# Patient Record
Sex: Female | Born: 1995 | Race: White | Hispanic: Yes | Marital: Married | Smoking: Former smoker
Health system: Southern US, Community
[De-identification: ages and names within clinical notes are randomized; demographics above are authoritative.]

## PROBLEM LIST (undated history)

## (undated) DIAGNOSIS — E282 Polycystic ovarian syndrome: Secondary | ICD-10-CM

---
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
## 2013-01-16 ENCOUNTER — Emergency Department (HOSPITAL_COMMUNITY)
Admission: EM | Admit: 2013-01-16 | Discharge: 2013-01-16 | Disposition: A | Discharge status: Home / Self Care | Payer: Medicaid Other | Attending: Emergency Medicine | Admitting: Emergency Medicine

## 2013-01-16 ENCOUNTER — Encounter (HOSPITAL_COMMUNITY): Payer: Self-pay | Admitting: Emergency Medicine

## 2013-01-16 DIAGNOSIS — Z8719 Personal history of other diseases of the digestive system: Secondary | ICD-10-CM | POA: Insufficient documentation

## 2013-01-16 DIAGNOSIS — R109 Unspecified abdominal pain: Secondary | ICD-10-CM

## 2013-01-16 DIAGNOSIS — F17200 Nicotine dependence, unspecified, uncomplicated: Secondary | ICD-10-CM | POA: Insufficient documentation

## 2013-01-16 DIAGNOSIS — R1013 Epigastric pain: Secondary | ICD-10-CM | POA: Insufficient documentation

## 2013-01-16 DIAGNOSIS — N390 Urinary tract infection, site not specified: Secondary | ICD-10-CM | POA: Insufficient documentation

## 2013-01-16 DIAGNOSIS — Z3202 Encounter for pregnancy test, result negative: Secondary | ICD-10-CM | POA: Insufficient documentation

## 2013-01-16 LAB — CBC WITH DIFFERENTIAL/PLATELET
Hemoglobin: 15.7 g/dL — ABNORMAL HIGH (ref 12.0–15.0)
Lymphocytes Relative: 21 % (ref 12–46)
Lymphs Abs: 1.8 10*3/uL (ref 0.7–4.0)
MCH: 31.3 pg (ref 26.0–34.0)
Monocytes Relative: 5 % (ref 3–12)
Neutro Abs: 6.6 10*3/uL (ref 1.7–7.7)
Neutrophils Relative %: 74 % (ref 43–77)
RBC: 5.02 MIL/uL (ref 3.87–5.11)

## 2013-01-16 LAB — URINALYSIS, ROUTINE W REFLEX MICROSCOPIC
Bilirubin Urine: NEGATIVE
Nitrite: NEGATIVE
Specific Gravity, Urine: 1.02 (ref 1.005–1.030)
pH: 6.5 (ref 5.0–8.0)

## 2013-01-16 LAB — BASIC METABOLIC PANEL
BUN: 6 mg/dL (ref 6–23)
CO2: 26 mEq/L (ref 19–32)
Chloride: 102 mEq/L (ref 96–112)
Glucose, Bld: 94 mg/dL (ref 70–99)
Potassium: 3.7 mEq/L (ref 3.5–5.1)

## 2013-01-16 LAB — URINE MICROSCOPIC-ADD ON

## 2013-01-16 MED ORDER — NITROFURANTOIN MONOHYD MACRO 100 MG PO CAPS: 100.0000 mg | ORAL_CAPSULE | Freq: Two times a day (BID) | ORAL | Status: DC

## 2013-01-16 MED ORDER — FAMOTIDINE 20 MG PO TABS: 20.0000 mg | ORAL_TABLET | Freq: Two times a day (BID) | ORAL | Status: DC

## 2013-01-16 MED ORDER — FAMOTIDINE 20 MG PO TABS
40.0000 mg | ORAL_TABLET | Freq: Once | ORAL | Status: AC
  Administered 2013-01-16: 40 mg via ORAL
  Filled 2013-01-16: qty 2

## 2013-01-16 MED ORDER — GI COCKTAIL ~~LOC~~
30.0000 mL | Freq: Once | ORAL | Status: AC
  Administered 2013-01-16: 30 mL via ORAL
  Filled 2013-01-16: qty 30

## 2013-01-16 NOTE — ED Notes (Signed)
POCT pregnancy Negative 

## 2013-01-16 NOTE — ED Notes (Signed)
Pt presents with 2 day h/o generalized abdominal pain x 2 days.  Pt reports pain began to suprapubic area, radiates throughout abdomen and around to bilateral flanks.; +nausea and diarrhea.  Pt denies any dysuria, dark-colored urine or vaginal discharge.

## 2013-01-16 NOTE — ED Notes (Signed)
Pt c/o generalized abd pain into back with some dysuria; pt sts irregular periods with LMP in November; pt sts some nausea and diarrhea

## 2013-01-16 NOTE — ED Provider Notes (Signed)
History     CSN: [id number]  Arrival date & time 01/16/13  1151   First MD Initiated Contact with Patient 01/16/13 1416      Chief Complaint  Patient presents with  . Abdominal Pain    (Consider location/radiation/quality/duration/timing/severity/associated sxs/prior treatment) HPI Comments: Patient presents with complaint of upper nominal pain described as a burning sensation for the past 2-3 days. Patient states that the pain radiates to her back at times. She states that she has had symptoms similar to this with previous episodes of gastritis. No treatments prior to arrival. No history of abdominal surgeries. No fever, nausea, vomiting, or diarrhea. No urinary symptoms. Patient states that her menstrual periods are regular. No current bleeding or discharge. Patient denies heavy NSAID use, heavy alcohol use. Symptoms not associated with food or time of day. Onset of symptoms gradual. Course is constant. Nothing makes symptoms better or worse.  The history is provided by the patient.    History reviewed. No pertinent past medical history.  History reviewed. No pertinent past surgical history.  History reviewed. No pertinent family history.  History  Substance Use Topics  . Smoking status: Current Every Day Smoker -- 0.00 packs/day for 1 years    Types: Cigarettes  . Smokeless tobacco: Not on file  . Alcohol Use: No    OB History   Grav Para Term Preterm Abortions TAB SAB Ect Mult Living                  Review of Systems  Constitutional: Negative for fever.  HENT: Negative for sore throat and rhinorrhea.   Eyes: Negative for redness.  Respiratory: Negative for cough.   Cardiovascular: Negative for chest pain.  Gastrointestinal: Positive for abdominal pain. Negative for nausea, vomiting and diarrhea.  Genitourinary: Negative for dysuria.  Musculoskeletal: Negative for myalgias.  Skin: Negative for rash.  Neurological: Negative for headaches.    Allergies  Review  of patient's allergies indicates no known allergies.  Home Medications   Current Outpatient Rx  Name  Route  Sig  Dispense  Refill  . famotidine (PEPCID) 20 MG tablet   Oral   Take 1 tablet (20 mg total) by mouth 2 (two) times daily.   20 tablet   0   . nitrofurantoin, macrocrystal-monohydrate, (MACROBID) 100 MG capsule   Oral   Take 1 capsule (100 mg total) by mouth 2 (two) times daily.   10 capsule   0     BP 134/77  Pulse 107  Temp(Src) 98 F (36.7 C) (Oral)  Resp 18  SpO2 100%  Physical Exam  Nursing note and vitals reviewed. Constitutional: She appears well-developed and well-nourished.  HENT:  Head: Normocephalic and atraumatic.  Eyes: Conjunctivae are normal. Right eye exhibits no discharge. Left eye exhibits no discharge.  Neck: Normal range of motion. Neck supple.  Cardiovascular: Normal rate, regular rhythm and normal heart sounds.   Pulmonary/Chest: Effort normal and breath sounds normal.  Abdominal: Soft. Bowel sounds are normal. She exhibits no distension. There is tenderness (Mild, epigastric). There is no rebound and no guarding.  Neurological: She is alert.  Skin: Skin is warm and dry.  Psychiatric: She has a normal mood and affect.    ED Course  Procedures (including critical care time)  Labs Reviewed  URINALYSIS, ROUTINE W REFLEX MICROSCOPIC - Abnormal; Notable for the following:    APPearance HAZY (*)    Hgb urine dipstick TRACE (*)    Leukocytes, UA LARGE (*)  All other components within normal limits  CBC WITH DIFFERENTIAL - Abnormal; Notable for the following:    Hemoglobin 15.7 (*)    All other components within normal limits  BASIC METABOLIC PANEL - Abnormal; Notable for the following:    Creatinine, Ser 0.48 (*)    All other components within normal limits  URINE MICROSCOPIC-ADD ON - Abnormal; Notable for the following:    Squamous Epithelial / LPF MANY (*)    Bacteria, UA FEW (*)    All other components within normal limits   URINE CULTURE  LIPASE, BLOOD  POCT PREGNANCY, URINE   No results found.   1. Abdominal pain   2. UTI (lower urinary tract infection)     3:27 PM Patient seen and examined.   Vital signs reviewed and are as follows: Filed Vitals:   01/16/13 1154  BP: 134/77  Pulse: 107  Temp: 98 F (36.7 C)  Resp: 18   Patient informed of all results. She was given a GI cocktail in emergency department with minimal relief.  Will continue to treat with acid blocking medications. Will discharge home with antibiotic for urinary tract infection.  The patient was urged to return to the Emergency Department immediately with worsening of current symptoms, worsening abdominal pain, persistent vomiting, blood noted in stools, fever, or any other concerns. The patient verbalized understanding.      MDM  Patient with epigastric, burning pain most consistent with PUD/GERD. Will treat empirically. Minimal tenderness. Lab work is reassuring indication for CT scan at this time. Return instructions given. UA suggestive of UTI, no pyelo, no CVA tenderness. Patient appears well, non-toxic.          [place], [place] 01/16/13 1616

## 2013-01-17 LAB — URINE CULTURE

## 2013-01-19 NOTE — ED Provider Notes (Signed)
Medical screening examination/treatment/procedure(s) were performed by non-physician practitioner and as supervising physician I was immediately available for consultation/collaboration.   Gwyneth Sprout, MD 01/19/13 2303

## 2013-03-21 IMAGING — US US PELVIS COMPLETE
1 series · 13 of 25 positions shown · non-contrast
Comparison: CT of the abdomen and pelvis performed 05/28/2012

CLINICAL DATA: Vaginal bleeding and bilateral adnexal pain.
History of polycystic ovarian syndrome.

TRANSABDOMINAL AND TRANSVAGINAL ULTRASOUND OF PELVIS
TECHNIQUE: Both transabdominal and transvaginal ultrasound
examinations of the pelvis were performed. Transabdominal technique
was performed for global imaging of the pelvis including uterus,
ovaries, adnexal regions, and pelvic cul-de-sac.
It was necessary to proceed with endovaginal exam following the
transabdominal exam to visualize the uterus and ovaries in greater
detail.

[Series 1: us pelvis complete · 0.25mm/px · 84 acquisitions, 13 frames shown]
[im 1/84]
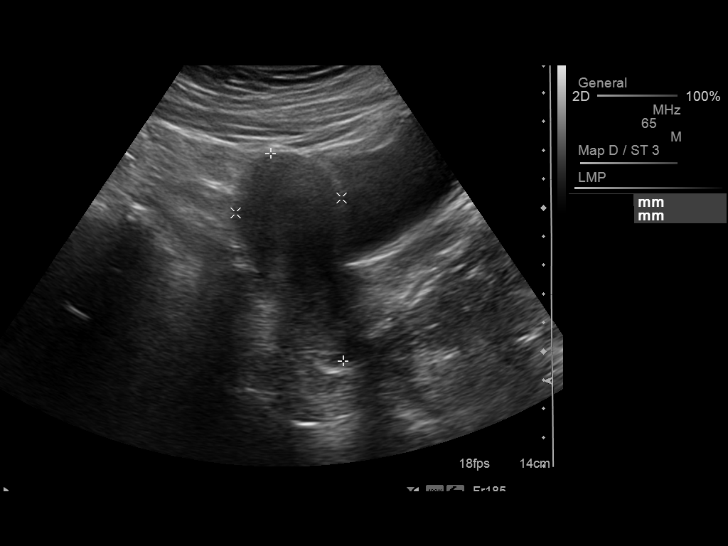
[im 7/84]
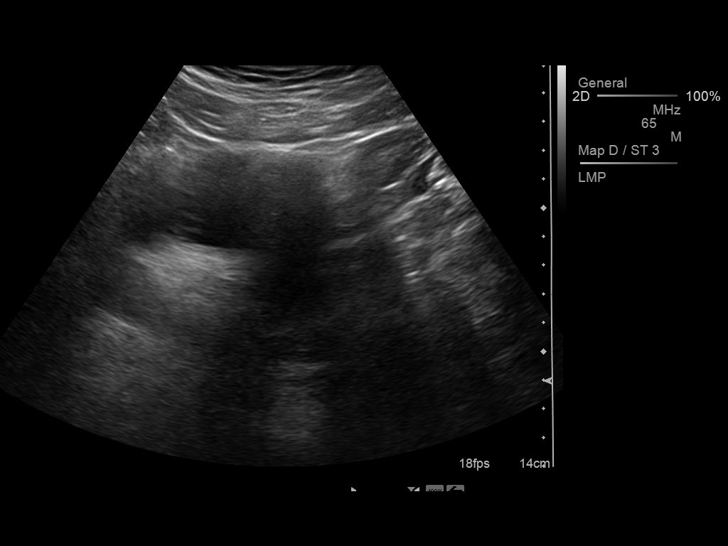
[im 14/84]
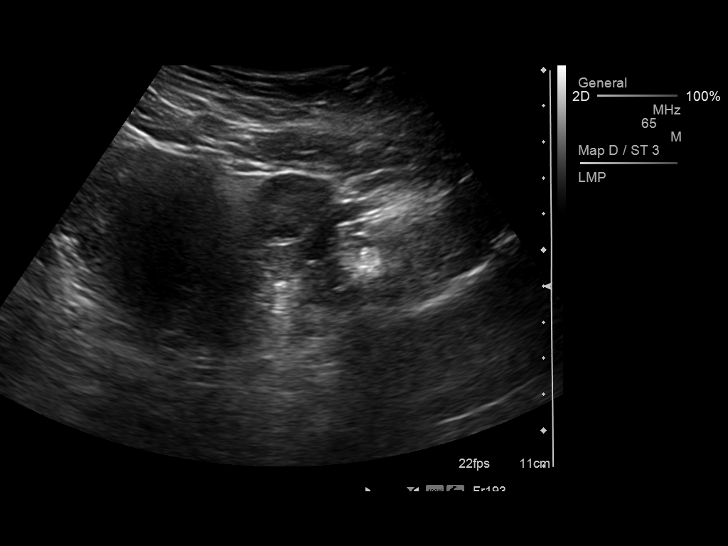
[im 21/84]
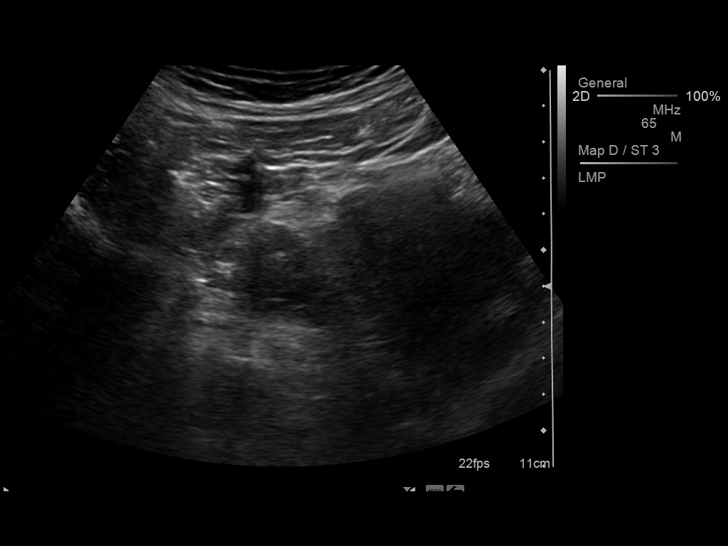
[im 28/84]
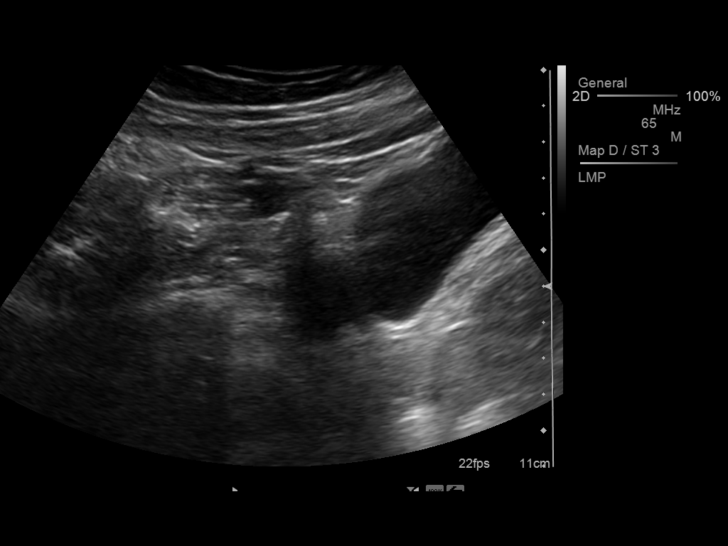
[im 35/84]
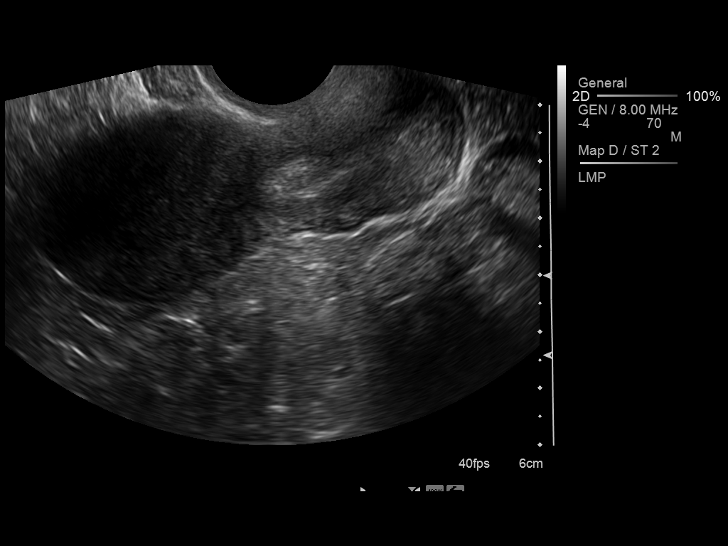
[im 42/84]
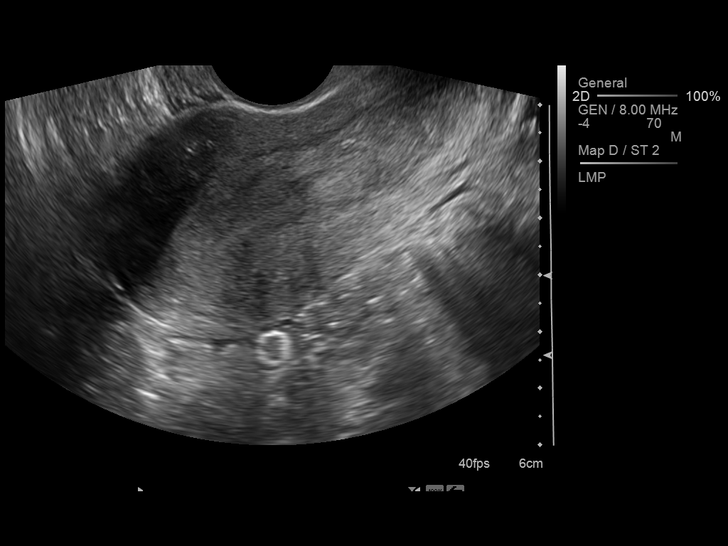
[im 49/84]
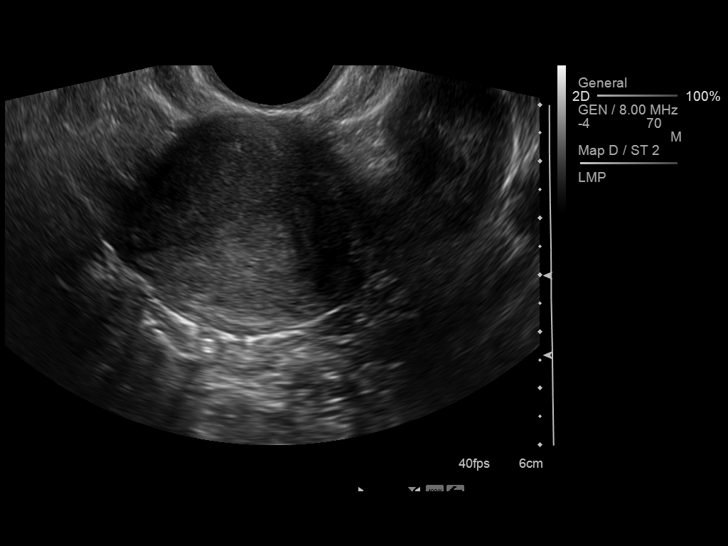
[im 56/84]
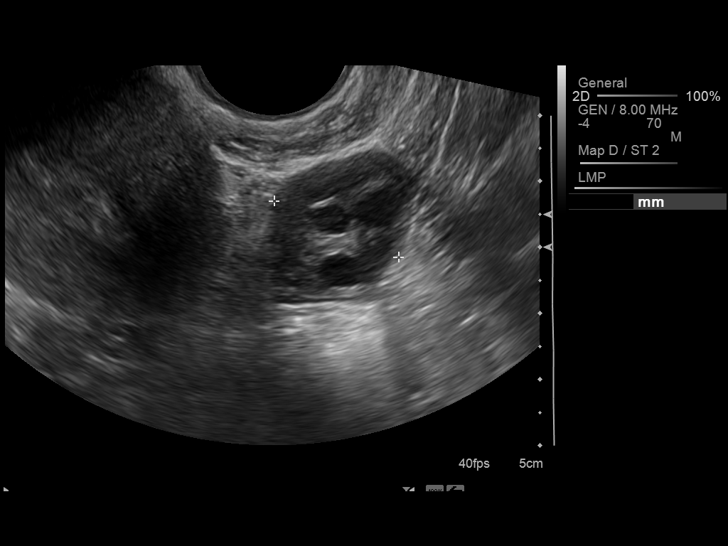
[im 63/84]
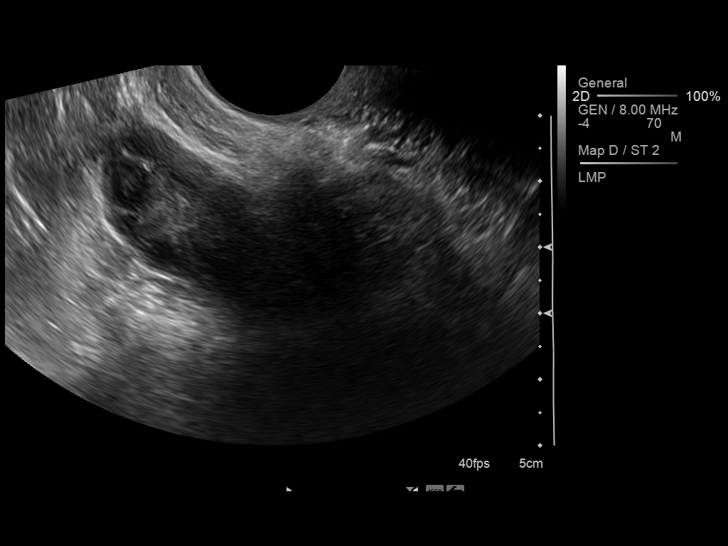
[im 70/84]
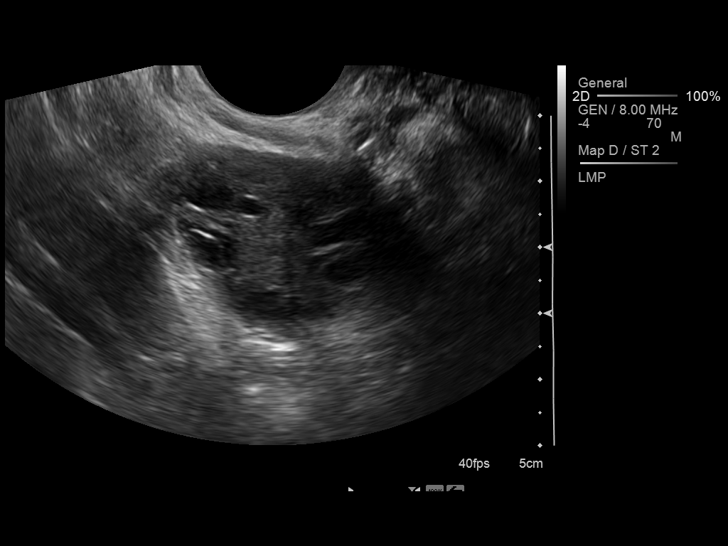
[im 77/84]
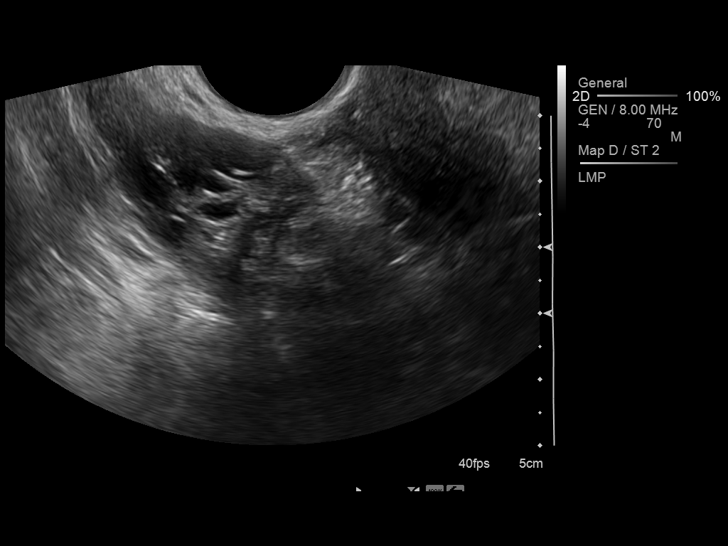
[im 84/84]
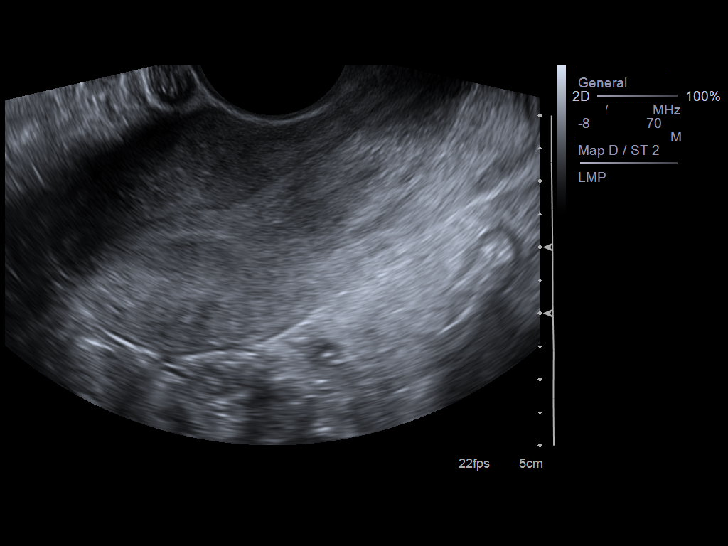

[13 of 25 positions shown; findings below may reference images not displayed]

FINDINGS: Uterus: Normal in size; mildly heterogeneous myometrial
echogenicity, nonspecific in appearance.  Measures 7.9 x 3.4 x
cm.

Endometrium: Mild apparent thickening likely reflects the secretory
phase; measures 1.4 cm in thickness.

Right ovary:  Scattered follicles remain within normal limits.  No
adnexal mass; measures 3.8 x 2.3 x 2.8 cm.

Left ovary: Scattered follicles remain within normal limits.  No
adnexal mass; measures 3.9 x 1.8 x 2.1 cm.

Other findings: A trace amount of free fluid is noted at the pelvic
cul-de-sac, likely physiologic in nature.
IMPRESSION: Normal study.  No evidence of pelvic mass or other significant
abnormality.

## 2013-05-11 ENCOUNTER — Ambulatory Visit (INDEPENDENT_AMBULATORY_CARE_PROVIDER_SITE_OTHER): Payer: Medicaid Other | Admitting: Obstetrics & Gynecology

## 2013-05-11 ENCOUNTER — Encounter: Payer: Self-pay | Admitting: Obstetrics & Gynecology

## 2013-05-11 VITALS — BP 118/73 | HR 91 | Temp 98.3°F | Ht 60.0 in | Wt 150.0 lb

## 2013-05-11 DIAGNOSIS — Z3202 Encounter for pregnancy test, result negative: Secondary | ICD-10-CM

## 2013-05-11 DIAGNOSIS — Z0289 Encounter for other administrative examinations: Secondary | ICD-10-CM

## 2013-05-11 DIAGNOSIS — Z0000 Encounter for general adult medical examination without abnormal findings: Secondary | ICD-10-CM | POA: Insufficient documentation

## 2013-05-11 DIAGNOSIS — Z3169 Encounter for other general counseling and advice on procreation: Secondary | ICD-10-CM

## 2013-05-11 NOTE — Patient Instructions (Addendum)
Preparing for Pregnancy Preparing for pregnancy (preconceptual care) by getting counseling and information from your caregiver before getting pregnant is a good idea. It will help you and your baby have a better chance to have a healthy, safe pregnancy and delivery of your baby. Make an appointment with your caregiver to talk about your health, medical, and family history and how to prepare yourself before getting pregnant. Your caregiver will do a complete physical exam and a Pap test. They will want to know:  About you, your spouse or partner, and your family's medical and genetic history.  If you are eating a balanced diet and drinking enough fluids.  What vitamins and mineral supplements you are taking. This includes taking folic acid before getting pregnant to help prevent birth defects.  What medications you are taking including prescription, over-the-counter and herbal medications.  If there is any substance abuse like alcohol, smoking, and illegal drugs.  If there is any mental or physical domestic violence.  If there is any risk of sexually transmitted disease between you and your partner.  What immunizations and vaccinations you have had and what you may need before getting pregnant.  If you should get tested for HIV infection.  If there is any exposure to chemical or toxic substances at home or work.  If there are medical problems you have that need to be treated and kept under control before getting pregnant such as diabetes, high blood pressure or others.  If there were any past surgeries, pregnancies and problems with them.  What your current weight is and to set a goal as to how much weight you should gain while pregnant. Also, they will check if you should lose or gain weight before getting pregnant.  What is your exercise routine and what it is safe when you are pregnant.  If there are any physical disabilities that need to be addressed.  About spacing your  pregnancies when there are other children.  If there is a financial problem that may affect you having a child. After talking about the above points with your caregiver, your caregiver will give you advice on how to help treat and work with you on solving any issues, if necessary, before getting pregnant. The goal is to have a healthy and safe pregnancy for you and your baby. You should keep an accurate record of your menstrual periods because it will help in determining your due date. Immunizations that you should have before getting pregnant:   Regular measles, German measles (rubella) and mumps.  Tetanus and diphtheria.  Chickenpox, if not immune.  Herpes zoster (Varicella) if not immune.  Human papilloma virus vaccine (HPV) between the age of 9 and 26 years old.  Hepatitis A vaccine.  Hepatitis B vaccine.  Influenza vaccine.  Pneumococcal vaccine (pneumonia). You should avoid getting pregnant for one month after getting vaccinated with a live virus vaccine such as German measles (rubella) vaccine. Other immunizations may be necessary depending on where you live, such as malaria. Ask your caregiver if any other immunizations are needed for you. HOME CARE INSTRUCTIONS   Follow the advice of your caregiver.  Before getting pregnant:  Begin taking vitamins, supplements, and 0.4 milligrams folic acid daily.  Get your immunizations up-to-date.  Get help from a nutrition counselor if you do not understand what a balanced diet is, need help with a special medical diet or if you need help to lose or gain weight.  Begin exercising.  Stop smoking, taking illegal drugs,   and drinking alcoholic beverages.  Get counseling if there is and type of domestic violence.  Get checked for sexually transmitted diseases including HIV.  Get any medical problems under control (diabetes, high blood pressure, convulsions, asthma or others).  Resolve any financial concerns.  Be sure you and  your spouse or partner are ready to have a baby.  Keep an accurate record of your menstrual periods. Document Released: 10/18/2008 Document Revised: 01/28/2012 Document Reviewed: 10/18/2008 Exit[employer] Patient Information 2014 [employer].  

## 2013-05-11 NOTE — Progress Notes (Signed)
.   Subjective:     Yolanda Wallace is a 17 y.o. female here for a routine exam.  Current complaints: abnormal white discharge.  Denies any itching, burning or odor. Personal health questionnaire reviewed: no.   Gynecologic History Patient's last menstrual period was 04/19/2013. Contraception: none Last Pap:N/A Last mammogram: N/A  Obstetric History OB History   Grav Para Term Preterm Abortions TAB SAB Ect Mult Living                   The following portions of the patient's history were reviewed and updated as appropriate: allergies, current medications, past family history, past medical history, past social history, past surgical history and problem list.  Review of Systems Pertinent items are noted in HPI.    Objective:    General appearance: alert Breasts: normal appearance, no masses or tenderness Abdomen: soft, non-tender; bowel sounds normal; no masses,  no organomegaly Pelvic: cervix normal in appearance, external genitalia normal, no adnexal masses or tenderness, uterus normal size, shape, and consistency and vagina normal without discharge      Assessment:    Healthy female exam.    Plan:    Follow-up prn

## 2013-05-12 LAB — HIV ANTIBODY (ROUTINE TESTING W REFLEX): HIV: NONREACTIVE

## 2013-05-12 LAB — OBSTETRIC PANEL
Basophils Relative: 0 % (ref 0–1)
Eosinophils Absolute: 0.1 10*3/uL (ref 0.0–0.7)
Eosinophils Relative: 1 % (ref 0–5)
Lymphs Abs: 2.5 10*3/uL (ref 0.7–4.0)
MCH: 26.9 pg (ref 26.0–34.0)
MCHC: 32.1 g/dL (ref 30.0–36.0)
MCV: 84 fL (ref 78.0–100.0)
Monocytes Relative: 7 % (ref 3–12)
Platelets: 284 10*3/uL (ref 150–400)
RBC: 4.68 MIL/uL (ref 3.87–5.11)
Rh Type: POSITIVE

## 2013-05-12 LAB — GC/CHLAMYDIA PROBE AMP
CT Probe RNA: NEGATIVE
GC Probe RNA: NEGATIVE

## 2013-05-12 LAB — VITAMIN D 25 HYDROXY (VIT D DEFICIENCY, FRACTURES): Vit D, 25-Hydroxy: 28 ng/mL — ABNORMAL LOW (ref 30–89)

## 2013-05-13 LAB — HEMOGLOBINOPATHY EVALUATION
Hgb A: 97.4 % (ref 96.8–97.8)
Hgb F Quant: 0 % (ref 0.0–2.0)
Hgb S Quant: 0 %

## 2014-03-23 ENCOUNTER — Ambulatory Visit: Payer: Medicaid Other | Admitting: Advanced Practice Midwife

## 2015-01-27 ENCOUNTER — Telehealth: Payer: Self-pay | Admitting: *Deleted

## 2015-01-27 NOTE — Telephone Encounter (Signed)
Patient states she is a patient, but it has been over a year since her last appointment. She has family planning medicaid and she can not use that at our office. Patient states she is having irregular cycles and she has been bleeding for about 3 months now. Patient is not using anything to regulate her cycle. Patient state she is feeling weak. Patient has been using Mocke Root powder. Gave patient cost for self pay and she will call us back for an appointment.

## 2015-02-22 ENCOUNTER — Ambulatory Visit: Payer: Medicaid Other | Admitting: Obstetrics

## 2015-03-03 ENCOUNTER — Ambulatory Visit (INDEPENDENT_AMBULATORY_CARE_PROVIDER_SITE_OTHER): Payer: Self-pay | Admitting: Obstetrics

## 2015-03-03 ENCOUNTER — Ambulatory Visit: Payer: Medicaid Other | Admitting: Obstetrics

## 2015-03-03 VITALS — BP 121/73 | HR 91 | Resp 98 | Wt 155.0 lb

## 2015-03-03 DIAGNOSIS — Z309 Encounter for contraceptive management, unspecified: Secondary | ICD-10-CM

## 2015-03-03 DIAGNOSIS — Z3169 Encounter for other general counseling and advice on procreation: Secondary | ICD-10-CM

## 2015-03-03 DIAGNOSIS — N939 Abnormal uterine and vaginal bleeding, unspecified: Secondary | ICD-10-CM

## 2015-03-03 DIAGNOSIS — Z30011 Encounter for initial prescription of contraceptive pills: Secondary | ICD-10-CM

## 2015-03-03 MED ORDER — NORETHINDRONE-ETH ESTRADIOL 1-35 MG-MCG PO TABS: 1.0000 | ORAL_TABLET | Freq: Every day | ORAL | Status: DC

## 2015-03-03 MED ORDER — PNV PRENATAL PLUS MULTIVITAMIN 27-1 MG PO TABS: 1.0000 | ORAL_TABLET | Freq: Every day | ORAL | Status: DC

## 2015-03-04 ENCOUNTER — Encounter: Payer: Self-pay | Admitting: Obstetrics

## 2015-03-04 NOTE — Progress Notes (Signed)
Patient ID: Yolanda Wallace, female   DOB: 02/16/1996, 19 y.o.   MRN: [id number]  Chief Complaint  Patient presents with  . Menstrual Problem    prolong periods,  irregular bleeding since January    HPI Yolanda MainlandMiriam Wallace is a 19 y.o. female.  H/O irregular cycles.  HPI  History reviewed. No pertinent past medical history.  History reviewed. No pertinent past surgical history.  Family History  Problem Relation Age of Onset  . Parkinson's disease Maternal Grandfather     Social History History  Substance Use Topics  . Smoking status: Former Smoker -- 0.00 packs/day for 1 years    Types: Cigarettes    Quit date: 02/17/2013  . Smokeless tobacco: Not on file  . Alcohol Use: No    No Known Allergies  Current Outpatient Prescriptions  Medication Sig Dispense Refill  . norethindrone-ethinyl estradiol 1/35 (NORTREL 1/35, 28,) tablet Take 1 tablet by mouth daily. 1 Package 11  . Prenatal Vit-Fe Fumarate-FA (PNV PRENATAL PLUS MULTIVITAMIN) 27-1 MG TABS Take 1 tablet by mouth daily before breakfast. 30 tablet 11   No current facility-administered medications for this visit.    Review of Systems Review of Systems Constitutional: negative for fatigue and weight loss Respiratory: negative for cough and wheezing Cardiovascular: negative for chest pain, fatigue and palpitations Gastrointestinal: negative for abdominal pain and change in bowel habits Genitourinary:negative Integument/breast: negative for nipple discharge Musculoskeletal:negative for myalgias Neurological: negative for gait problems and tremors Behavioral/Psych: negative for abusive relationship, depression Endocrine: negative for temperature intolerance     Blood pressure 121/73, pulse 91, resp. rate 98, weight 155 lb (70.308 kg), last menstrual period 02/15/2015.  Physical Exam Physical Exam General:   alert  Skin:   no rash or abnormalities  Lungs:   clear to auscultation bilaterally  Heart:   regular rate  and rhythm, S1, S2 normal, no murmur, click, rub or gallop  Breasts:   normal without suspicious masses, skin or nipple changes or axillary nodes  Abdomen:  normal findings: no organomegaly, soft, non-tender and no hernia  Pelvis:  External genitalia: normal general appearance Urinary system: urethral meatus normal and bladder without fullness, nontender Vaginal: normal without tenderness, induration or masses Cervix: normal appearance Adnexa: normal bimanual exam Uterus: anteverted and non-tender, normal size      Data Reviewed Labs  Assessment     AUB     Plan    OCP's Rx for cycle regulation F/U in 3-4 months  No orders of the defined types were placed in this encounter.   Meds ordered this encounter  Medications  . norethindrone-ethinyl estradiol 1/35 (NORTREL 1/35, 28,) tablet    Sig: Take 1 tablet by mouth daily.    Dispense:  1 Package    Refill:  11  . Prenatal Vit-Fe Fumarate-FA (PNV PRENATAL PLUS MULTIVITAMIN) 27-1 MG TABS    Sig: Take 1 tablet by mouth daily before breakfast.    Dispense:  30 tablet    Refill:  11

## 2015-08-30 ENCOUNTER — Telehealth: Payer: Self-pay | Admitting: *Deleted

## 2015-08-30 NOTE — Telephone Encounter (Signed)
Patient states she was on the birth control to regulate her cycle. She stopped it in May and had regular cycles until September. She did not have a cycle in September and she has had a couple episodes of spotting,but nothing more. Her UPT at home have been negative. Patient states before she started the OCP she had irregular cycles- she would skips 2-3 months and then bleed for 1 month. Per Dr Clearance Coots- she may be returning to her baseline. Watch and wait for now- she may have to go back on the OCP for a few months to fool her body again. Patient to call back if her cycle does not start back. Patient is going to have her partenr get a sperm count done- he has not fathered children and she wants to make sure he is able.

## 2016-01-06 ENCOUNTER — Encounter: Payer: Self-pay | Admitting: Certified Nurse Midwife

## 2016-01-06 ENCOUNTER — Ambulatory Visit (INDEPENDENT_AMBULATORY_CARE_PROVIDER_SITE_OTHER): Payer: Medicaid Other | Admitting: Certified Nurse Midwife

## 2016-01-06 VITALS — BP 119/76 | HR 92 | Temp 98.2°F | Wt 154.0 lb

## 2016-01-06 DIAGNOSIS — A499 Bacterial infection, unspecified: Secondary | ICD-10-CM

## 2016-01-06 DIAGNOSIS — N760 Acute vaginitis: Secondary | ICD-10-CM

## 2016-01-06 DIAGNOSIS — B9689 Other specified bacterial agents as the cause of diseases classified elsewhere: Secondary | ICD-10-CM

## 2016-01-06 MED ORDER — FLUCONAZOLE 100 MG PO TABS
100.0000 mg | ORAL_TABLET | Freq: Once | ORAL | Status: DC
Start: 2016-01-06 — End: 2016-07-27

## 2016-01-06 MED ORDER — METRONIDAZOLE 500 MG PO TABS: 500.0000 mg | ORAL_TABLET | Freq: Two times a day (BID) | ORAL | Status: DC

## 2016-01-06 NOTE — Progress Notes (Signed)
Patient ID: Yolanda Wallace, female   DOB: 08-08-1996, 20 y.o.   MRN: [id number]  Chief Complaint  Patient presents with  . STD Check    HPI Yolanda Wallace is a 20 y.o. female.  Here for STD exam.  Recently had sexual intercourse with a new partner, is currently separated from her spouse.  Did use a condom.  Does not have regular insurance at the moment.  Has family planning medicaid.   The reason she desires STD testing is that she has had vaginal itching for about a week, denies any burning, smell or problems urinating.  Periods are regular.  Has not tried anything for this new problem.     HPI  History reviewed. No pertinent past medical history.  History reviewed. No pertinent past surgical history.  Family History  Problem Relation Age of Onset  . Parkinson's disease Maternal Grandfather     Social History Social History  Substance Use Topics  . Smoking status: Former Smoker -- 0.00 packs/day for 1 years    Types: Cigarettes    Quit date: 02/17/2013  . Smokeless tobacco: None  . Alcohol Use: 0.0 oz/week    0 Standard drinks or equivalent per week     Comment: Socially     No Known Allergies  Current Outpatient Prescriptions  Medication Sig Dispense Refill  . fluconazole (DIFLUCAN) 100 MG tablet Take 1 tablet (100 mg total) by mouth once. Repeat dose in 48-72 hour. 3 tablet 0  . metroNIDAZOLE (FLAGYL) 500 MG tablet Take 1 tablet (500 mg total) by mouth 2 (two) times daily. 14 tablet 0   No current facility-administered medications for this visit.    Review of Systems Review of Systems Constitutional: negative for fatigue and weight loss Respiratory: negative for cough and wheezing Cardiovascular: negative for chest pain, fatigue and palpitations Gastrointestinal: negative for abdominal pain and change in bowel habits Genitourinary: + vaginal itching Integument/breast: negative for nipple discharge Musculoskeletal:negative for myalgias Neurological: negative  for gait problems and tremors Behavioral/Psych: negative for abusive relationship, depression Endocrine: negative for temperature intolerance     Blood pressure 119/76, pulse 92, temperature 98.2 F (36.8 C), weight 154 lb (69.854 kg), last menstrual period 12/08/2015.  Physical Exam Physical Exam General:   alert  Skin:   no rash or abnormalities  Lungs:   clear to auscultation bilaterally  Heart:   regular rate and rhythm, S1, S2 normal, no murmur, click, rub or gallop  Breasts:   deferred  Abdomen:  normal findings: no organomegaly, soft, non-tender and no hernia  Pelvis:  External genitalia: normal general appearance Urinary system: urethral meatus normal and bladder without fullness, nontender Vaginal: normal without tenderness, induration or masses, + thick white chunky vaginal discharge Cervix: no CMT Adnexa: normal bimanual exam Uterus: anteverted and non-tender, normal size    50% of 15 min visit spent on counseling and coordination of care.  Wet prep:  +yeast buds, + clue cells, - Trichomonads, + white blood cells  Data Reviewed Previous medical hx, meds  Assessment     Vulvovaginal candidiasis  BV  high risk sexual behaviors     Plan    Orders Placed This Encounter  Procedures  . GC/Chlamydia Probe Amp    Order Specific Question:  Source    Answer:  urine   Meds ordered this encounter  Medications  . fluconazole (DIFLUCAN) 100 MG tablet    Sig: Take 1 tablet (100 mg total) by mouth once. Repeat dose in 48-72 hour.  Dispense:  3 tablet    Refill:  0  . metroNIDAZOLE (FLAGYL) 500 MG tablet    Sig: Take 1 tablet (500 mg total) by mouth 2 (two) times daily.    Dispense:  14 tablet    Refill:  0    Need to obtain previous records Possible management options include: contraception management, annual exam Follow up with annual exam/contraception.

## 2016-01-09 LAB — GC/CHLAMYDIA PROBE AMP
CT PROBE, AMP APTIMA: NOT DETECTED
GC PROBE AMP APTIMA: NOT DETECTED

## 2016-03-16 ENCOUNTER — Ambulatory Visit: Payer: Medicaid Other | Admitting: Certified Nurse Midwife

## 2016-05-02 ENCOUNTER — Telehealth: Payer: Self-pay | Admitting: *Deleted

## 2016-05-02 NOTE — Telephone Encounter (Signed)
Patient requested call back- no reason.

## 2016-06-28 ENCOUNTER — Telehealth: Payer: Self-pay | Admitting: *Deleted

## 2016-06-28 NOTE — Telephone Encounter (Signed)
Patient states has a hx of abnormal cycles,took a pregnancy test that was negative but does have some spotting.Asked patient to give it another week and then she may want to have a quant HCG instead of a UPT.

## 2016-07-10 ENCOUNTER — Ambulatory Visit: Payer: Medicaid Other | Admitting: Certified Nurse Midwife

## 2016-07-27 ENCOUNTER — Ambulatory Visit (INDEPENDENT_AMBULATORY_CARE_PROVIDER_SITE_OTHER): Payer: Self-pay | Admitting: Certified Nurse Midwife

## 2016-07-27 ENCOUNTER — Encounter: Payer: Self-pay | Admitting: Certified Nurse Midwife

## 2016-07-27 VITALS — BP 114/79 | HR 92 | Wt 145.0 lb

## 2016-07-27 DIAGNOSIS — Z30019 Encounter for initial prescription of contraceptives, unspecified: Secondary | ICD-10-CM

## 2016-07-27 DIAGNOSIS — Z3009 Encounter for other general counseling and advice on contraception: Secondary | ICD-10-CM

## 2016-07-27 DIAGNOSIS — Z3202 Encounter for pregnancy test, result negative: Secondary | ICD-10-CM

## 2016-07-27 LAB — POCT URINE PREGNANCY: Preg Test, Ur: NEGATIVE

## 2016-07-27 MED ORDER — NORGESTIMATE-ETH ESTRADIOL 0.25-35 MG-MCG PO TABS: 1.0000 | ORAL_TABLET | Freq: Every day | ORAL | 11 refills | Status: DC

## 2016-07-27 NOTE — Progress Notes (Signed)
Subjective:    Yolanda Wallace is a 20 y.o. female who presents for contraception counseling. The patient has no complaints today. The patient is sexually active. Pertinent past medical history: none.  Hx of irregular periods.  Has a period currently around every 1-2 months, reports regular flow and lasts 7 days.  LMP was 8/14.  Quit smoking recently.  Discussed ACHES.  Discussed condom use for STD prevention.  Encouraged her to get insurance [facility] since she does not have health insurance currently.  Is currently employed.    The information documented in the HPI was reviewed and verified.  Menstrual History: OB History    No data available      Menarche age: 20 years of age Patient's last menstrual period was 07/02/2016.   Patient Active Problem List   Diagnosis Date Noted  . Routine general medical examination at a health care facility 05/11/2013   No past medical history on file.  No past surgical history on file.   Current Outpatient Prescriptions:  .  norgestimate-ethinyl estradiol (ORTHO-CYCLEN,SPRINTEC,PREVIFEM) 0.25-35 MG-MCG tablet, Take 1 tablet by mouth daily., Disp: 1 Package, Rfl: 11 No Known Allergies  Social History  Substance Use Topics  . Smoking status: Former Smoker    Packs/day: 0.00    Years: 1.00    Types: Cigarettes    Quit date: 02/17/2013  . Smokeless tobacco: Not on file  . Alcohol use 0.0 oz/week     Comment: Socially     Family History  Problem Relation Age of Onset  . Parkinson's disease Maternal Grandfather        Review of Systems Constitutional: negative for weight loss Genitourinary:negative for abnormal menstrual periods and vaginal discharge   Objective:   BP 114/79   Pulse 92   Wt 145 lb (65.8 kg)   LMP 07/02/2016   BMI 28.32 kg/m    General:   alert  Skin:   no rash or abnormalities  Lungs:   clear to auscultation bilaterally  Heart:   regular rate and rhythm, S1, S2 normal, no murmur,  click, rub or gallop  Breasts:   deferred  Abdomen:  normal findings: no organomegaly, soft, non-tender and no hernia   Lab Review Urine pregnancy test Labs reviewed no Radiologic studies reviewed no  75% of 15 min visit spent on counseling and coordination of care.   Assessment:    20 y.o., starting OCP (estrogen/progesterone), no contraindications.   Plan:    All questions answered.  Meds ordered this encounter  Medications  . norgestimate-ethinyl estradiol (ORTHO-CYCLEN,SPRINTEC,PREVIFEM) 0.25-35 MG-MCG tablet    Sig: Take 1 tablet by mouth daily.    Dispense:  1 Package    Refill:  11   Orders Placed This Encounter  Procedures  . POCT urine pregnancy   Need to obtain previous records Follow up as needed.

## 2017-02-18 ENCOUNTER — Ambulatory Visit: Payer: Medicaid Other | Admitting: Obstetrics & Gynecology

## 2017-07-08 ENCOUNTER — Encounter: Payer: Self-pay | Admitting: Obstetrics & Gynecology

## 2017-07-08 ENCOUNTER — Other Ambulatory Visit (HOSPITAL_COMMUNITY)
Admission: RE | Admit: 2017-07-08 | Discharge: 2017-07-08 | Disposition: A | Discharge status: Home / Self Care | Payer: Medicaid Other | Source: Ambulatory Visit | Attending: Obstetrics & Gynecology | Admitting: Obstetrics & Gynecology

## 2017-07-08 ENCOUNTER — Ambulatory Visit (INDEPENDENT_AMBULATORY_CARE_PROVIDER_SITE_OTHER): Payer: Self-pay | Admitting: Obstetrics & Gynecology

## 2017-07-08 VITALS — BP 114/73 | HR 79 | Resp 16 | Ht 60.0 in | Wt 165.0 lb

## 2017-07-08 DIAGNOSIS — Z308 Encounter for other contraceptive management: Secondary | ICD-10-CM

## 2017-07-08 DIAGNOSIS — Z124 Encounter for screening for malignant neoplasm of cervix: Secondary | ICD-10-CM | POA: Insufficient documentation

## 2017-07-08 DIAGNOSIS — N912 Amenorrhea, unspecified: Secondary | ICD-10-CM

## 2017-07-08 LAB — POCT URINE PREGNANCY: Preg Test, Ur: NEGATIVE

## 2017-07-08 NOTE — Progress Notes (Signed)
   Subjective:    Patient ID: Yolanda Wallace, female    DOB: [birth year], 21 y.o.   MRN: [id number]  HPI 21 yo separated HG0 here with the complaint of a skipped period in July and spotting, especially with sex, for the last 3 weeks.   She is not using contraception for 7 months, would like a pregnancy, on PNVs.   Review of Systems     Objective:   Physical Exam Abd- benign Cervix- nulliparous, no lesion, frothy bloody discharge present in vaginal vault       Assessment & Plan:  Preventative care- pap smear sent Spotting/bleeding with sex- check cervical cultures and TSH Discussed that the average couple takes a year to conceive.

## 2017-07-09 LAB — TSH: TSH: 4.11 m[IU]/L

## 2017-07-12 LAB — CYTOLOGY - PAP
BACTERIAL VAGINITIS: POSITIVE — AB
CANDIDA VAGINITIS: NEGATIVE
CHLAMYDIA, DNA PROBE: NEGATIVE
HPV: DETECTED — AB
NEISSERIA GONORRHEA: NEGATIVE
Trichomonas: NEGATIVE

## 2017-07-16 ENCOUNTER — Telehealth: Payer: Self-pay | Admitting: *Deleted

## 2017-07-16 NOTE — Telephone Encounter (Signed)
Erroneous encounter

## 2017-07-23 ENCOUNTER — Encounter: Payer: Self-pay | Admitting: *Deleted

## 2017-07-23 ENCOUNTER — Telehealth: Payer: Self-pay | Admitting: *Deleted

## 2017-07-23 NOTE — Telephone Encounter (Signed)
LM on voicemail to call office to schedule a colposcopy and letter mailed to home address.

## 2017-07-23 NOTE — Telephone Encounter (Signed)
-----   Message from Allie BossierMyra C Dove, MD sent at 07/18/2017  4:46 PM EDT ----- She will need a colpo Thanks

## 2017-08-08 ENCOUNTER — Encounter: Payer: Self-pay | Admitting: Obstetrics & Gynecology

## 2017-08-27 ENCOUNTER — Ambulatory Visit (HOSPITAL_COMMUNITY)
Admission: RE | Admit: 2017-08-27 | Discharge: 2017-08-27 | Disposition: A | Discharge status: Home / Self Care | Payer: Self-pay | Source: Ambulatory Visit | Attending: Obstetrics and Gynecology | Admitting: Obstetrics and Gynecology

## 2017-08-27 ENCOUNTER — Encounter (HOSPITAL_COMMUNITY): Payer: Self-pay | Admitting: *Deleted

## 2017-08-27 ENCOUNTER — Encounter (HOSPITAL_COMMUNITY): Payer: Self-pay

## 2017-08-27 VITALS — BP 112/78 | Ht 60.0 in | Wt 169.0 lb

## 2017-08-27 DIAGNOSIS — R87611 Atypical squamous cells cannot exclude high grade squamous intraepithelial lesion on cytologic smear of cervix (ASC-H): Secondary | ICD-10-CM

## 2017-08-27 DIAGNOSIS — Z1239 Encounter for other screening for malignant neoplasm of breast: Secondary | ICD-10-CM

## 2017-08-27 HISTORY — DX: Polycystic ovarian syndrome: E28.2

## 2017-08-27 NOTE — Patient Instructions (Signed)
Explained breast self awareness with Truett Mainland. Patient did not need a Pap smear today due to last Pap smear was 07/08/2017. Explained the colposcopy the recommended follow-up for her abnormal Pap smear. Referred patient to the Center for [facility] at [place] for a colpscopy. Appointment scheduled for Tuesday, September 03, 2017 at 1430. Patient aware of appointment and will be there. Let patient know she will need a screening mammogram at age 64 unless clinically indicated prior. Brittinie Wherley verbalized understanding.  Kenli Waldo, Kathaleen Maser, RN 10:11 AM

## 2017-08-27 NOTE — Progress Notes (Signed)
Patient referred to [facility] by the [facility] for [facility] [place] due to having an abnormal Pap smear 07/08/2017 that a colposcopy is recommended for follow-up.  Pap Smear: Pap smear not completed today. Last Pap smear was 07/08/2017 at the [facility] for [facility] [place] and [facility] with positive HPV. Referred patient to the Center for [facility] at [place] for a colpscopy. Appointment scheduled for Tuesday, September 03, 2017 at 1430. Per patient has no history of an abnormal Pap smear prior to her most recent Pap smear. Last Pap smear result is in EPIC.  Physical exam: Breasts Breasts symmetrical. No skin abnormalities bilateral breasts. No nipple retraction bilateral breasts. No nipple discharge bilateral breasts. No lymphadenopathy. No lumps palpated bilateral breasts. No complaints of pain or tenderness on exam. Screening mammogram recommended at age 21 unless clinically indicated prior.     Pelvic/Bimanual No Pap smear completed today since last Pap smear was 07/08/2017. Pap smear not indicated per BCCCP guidelines.   Smoking History: Patient has never smoked.  Patient Navigation: Patient education provided. Access to services provided for patient through [facility] program.

## 2017-09-03 ENCOUNTER — Encounter: Payer: Self-pay | Admitting: Obstetrics & Gynecology

## 2017-09-11 ENCOUNTER — Encounter: Payer: Self-pay | Admitting: Obstetrics & Gynecology

## 2017-09-26 ENCOUNTER — Ambulatory Visit (INDEPENDENT_AMBULATORY_CARE_PROVIDER_SITE_OTHER): Payer: Self-pay | Admitting: Obstetrics & Gynecology

## 2017-09-26 ENCOUNTER — Encounter: Payer: Self-pay | Admitting: Obstetrics & Gynecology

## 2017-09-26 ENCOUNTER — Encounter: Payer: Self-pay | Admitting: *Deleted

## 2017-09-26 VITALS — BP 119/68 | Resp 16 | Ht 60.0 in | Wt 165.0 lb

## 2017-09-26 DIAGNOSIS — R87611 Atypical squamous cells cannot exclude high grade squamous intraepithelial lesion on cytologic smear of cervix (ASC-H): Secondary | ICD-10-CM

## 2017-09-26 DIAGNOSIS — R87810 Cervical high risk human papillomavirus (HPV) DNA test positive: Secondary | ICD-10-CM

## 2017-09-26 DIAGNOSIS — R87610 Atypical squamous cells of undetermined significance on cytologic smear of cervix (ASC-US): Secondary | ICD-10-CM

## 2017-09-26 DIAGNOSIS — Z3202 Encounter for pregnancy test, result negative: Secondary | ICD-10-CM

## 2017-09-26 LAB — POCT URINE PREGNANCY: PREG TEST UR: NEGATIVE

## 2017-09-26 NOTE — Addendum Note (Signed)
Addended by: Granville LewisLARK, LORA L on: 09/26/2017 03:23 PM   Modules accepted: Orders

## 2017-09-26 NOTE — Progress Notes (Signed)
   Subjective:    Patient ID: Yolanda MainlandMiriam Wallace, female    DOB: 06/06/1996, 21 y.o.   MRN: [id number]  HPI 21 yo S Hispanic P0 here for a colpo. She had a ASCUS + HR HPV pap, cannot rule out HGSIL.   Review of Systems     Objective:   Physical Exam Breathing, conversing, and ambulating normally UPT negative, consent signed, time out done Cervix prepped with acetic acid. Transformation zone seen in its entirety. Colpo adequate. Large ectropion Small vertical bit of acetowhite changes at the 12 o'clock position. I biopsied this. ECC obtained. She tolerated the procedure well.   Assessment & Plan:  Abnormal pap- await pathology Follow up 1 week for results

## 2017-10-15 ENCOUNTER — Encounter: Payer: Self-pay | Admitting: Obstetrics & Gynecology

## 2017-10-15 ENCOUNTER — Ambulatory Visit (INDEPENDENT_AMBULATORY_CARE_PROVIDER_SITE_OTHER): Payer: Self-pay | Admitting: Obstetrics & Gynecology

## 2017-10-15 VITALS — BP 129/77 | HR 101 | Wt 167.0 lb

## 2017-10-15 DIAGNOSIS — N871 Moderate cervical dysplasia: Secondary | ICD-10-CM

## 2017-10-15 NOTE — Progress Notes (Signed)
   Subjective:    Patient ID: Yolanda Wallace, female    DOB: [birth year], 21 y.o.   MRN: [id number]  HPI 21 yo S Hispanic P0 here to review her pathology after a colpo. She had a ASCUS + HR HPV pap, cannot rule out HGSIL.  Diagnosis 1. 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 - ENDOCERVICAL GLANDULAR TISSUE WITH LOCALIZED SMALL FRAGMENTS OF APPARENT ADHERENT DYSPLASTIC SQUAMOUS EPITHELIUM. - NO INVASIVE MALIGNANCY IDENTIFIED. 2. Endocervix, curettage - FRAGMENTS OF BENIGN ENDOCERVICAL GLANDULAR TISSUE. - NO ATYPIA, DYSPLASIA, OR MALIGNANCY IDENTIFIED.    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
